# Patient Record
Sex: Female | Born: 2000 | Race: White | Hispanic: No | Marital: Single | Smoking: Never smoker
Health system: Southern US, Community
[De-identification: ages and names within clinical notes are randomized; demographics above are authoritative.]

## PROBLEM LIST (undated history)

## (undated) DIAGNOSIS — Z23 Encounter for immunization: Secondary | ICD-10-CM

## (undated) DIAGNOSIS — B001 Herpesviral vesicular dermatitis: Secondary | ICD-10-CM

## (undated) HISTORY — DX: Encounter for immunization: Z23

## (undated) HISTORY — DX: Herpesviral vesicular dermatitis: B00.1

## (undated) HISTORY — PX: TONSILLECTOMY: SUR1361

## (undated) HISTORY — PX: WISDOM TOOTH EXTRACTION: SHX21

---
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
## 2020-05-21 ENCOUNTER — Other Ambulatory Visit: Payer: Self-pay

## 2020-05-21 MED ORDER — ACYCLOVIR 400 MG PO TABS
400.0000 mg | ORAL_TABLET | Freq: Three times a day (TID) | ORAL | 0 refills | Status: DC
Start: 2020-05-21 — End: 2020-07-01

## 2020-07-01 ENCOUNTER — Other Ambulatory Visit: Payer: Self-pay | Admitting: Certified Nurse Midwife

## 2020-07-15 ENCOUNTER — Other Ambulatory Visit: Payer: Self-pay | Admitting: Certified Nurse Midwife

## 2021-01-20 ENCOUNTER — Other Ambulatory Visit: Payer: Self-pay | Admitting: Certified Nurse Midwife

## 2021-03-03 ENCOUNTER — Encounter: Payer: BC Managed Care – PPO | Admitting: Certified Nurse Midwife

## 2021-03-31 ENCOUNTER — Other Ambulatory Visit: Payer: Self-pay

## 2021-03-31 MED ORDER — MEDROXYPROGESTERONE ACETATE 150 MG/ML IM SUSP: 150.0000 mg | INTRAMUSCULAR | 0 refills | Status: DC

## 2021-04-20 ENCOUNTER — Telehealth (INDEPENDENT_AMBULATORY_CARE_PROVIDER_SITE_OTHER): Payer: BC Managed Care – PPO | Admitting: Certified Nurse Midwife

## 2021-04-20 ENCOUNTER — Encounter: Payer: Self-pay | Admitting: Certified Nurse Midwife

## 2021-04-20 DIAGNOSIS — Z3042 Encounter for surveillance of injectable contraceptive: Secondary | ICD-10-CM | POA: Diagnosis not present

## 2021-04-20 DIAGNOSIS — Z3009 Encounter for other general counseling and advice on contraception: Secondary | ICD-10-CM

## 2021-04-20 NOTE — Patient Instructions (Signed)
Contraceptive Injection A contraceptive injection is a shot that prevents pregnancy. It is also called a birth control shot. The shot contains the hormone progestin, which prevents pregnancy by: Stopping the ovaries from releasing eggs. Thickening cervical mucus to prevent sperm from entering the cervix. Thinning the lining of the uterus to prevent a fertilized egg from attaching to the uterus. Contraceptive injections are given under the skin (subcutaneous) or into a muscle (intramuscular). For these shots to work, you must get one of them every 3 months (12-13 weeks) from a health care provider. Tell a health care provider about: Any allergies you have. All medicines you are taking, including vitamins, herbs, eye drops, creams, and over-the-counter medicines. Any blood disorders you have. Any medical conditions you have. Whether you are pregnant or may be pregnant. What are the risks? Generally, this is a safe procedure. However, problems may occur, including: Mood changes or depression. Loss of bone density (osteoporosis) after long-term use. Blood clots. This is rare. Higher risk of an egg being fertilized outside your uterus (ectopic pregnancy).This is rare. What happens before the procedure? Your health care provider may do a routine physical exam. You may have a test to make sure you are not pregnant. What happens during the procedure?  The area where the shot will be given will be cleaned and sanitized with alcohol. A needle will be inserted into a muscle in your upper arm or buttock, or into the skin of your thigh or abdomen. The needle will be attached to a syringe with the medicine inside of it. The medicine will be pushed through the syringe and injected into your body. A small bandage (dressing) may be placed over the injection site. What can I expect after the procedure? After the procedure, it is common to have: Soreness around the injection site for a couple of  days. Irregular menstrual bleeding. Weight gain. Breast tenderness. Headaches. Discomfort in your abdomen. Ask your health care provider whether you need to use an added method of birth control (backup contraception), such as a condom, sponge, or spermicide. If the first shot is given 1-7 days after the start of your last menstrual period, you will not need backup contraception. If the first shot is given at any other time during your menstrual cycle, you should avoid having sex. If you do have sex, you will need to use backup contraception for 7 days after you receive the shot. Follow these instructions at home: General instructions Take over-the-counter and prescription medicines only as told by your health care provider. Do not rub or massage the injection site. Track your menstrual periods so you will know if they become irregular. Always use a condom to protect against sexually transmitted infections (STIs). Make sure you schedule an appointment in time for your next shot and mark it on your calendar. You must get an injection every 3 months (12-13 weeks) to prevent pregnancy. Lifestyle Do not use any products that contain nicotine or tobacco. These products include cigarettes, chewing tobacco, and vaping devices, such as e-cigarettes. If you need help quitting, ask your health care provider. Eat foods that are high in calcium and vitamin D, such as milk, cheese, and salmon. Doing this may help with any loss in bone density caused by the contraceptive injection. Ask your health care provider for dietary recommendations. Contact a health care provider if you: Have nausea or vomiting. Have abnormal vaginal discharge or bleeding. Miss a menstrual period or think you might be pregnant. Experience mood changes   or depression. Feel dizzy or light-headed. Have leg pain. Get help right away if you: Have chest pain or cough up blood. Have shortness of breath. Have a severe headache that does  not go away. Have numbness in any part of your body. Have slurred speech or vision problems. Have vaginal bleeding that is abnormally heavy or does not stop, or you have severe pain in your abdomen. Have depression that does not get better with treatment. If you ever feel like you may hurt yourself or others, or have thoughts about taking your own life, get help right away. Go to your nearest emergency department or: Call your local emergency services (911 in the [place] Call a suicide crisis helpline, such as the National Suicide Prevention Lifeline at 1-800-273-8255. This is open 24 hours a day in the [place] Text the Crisis Text Line at 741741 (in the [place] Summary A contraceptive injection is a shot that prevents pregnancy. It is also called the birth control shot. The shot is given under the skin (subcutaneous) or into a muscle (intramuscular). After this procedure, it is common to have soreness around the injection site for a couple of days. To prevent pregnancy, the shot must be given by a health care provider every 3 months (12-13 weeks). After you have the shot, ask your health care provider whether you need to use an added method of birth control (backup contraception), such as a condom, sponge, or spermicide. This information is not intended to replace advice given to you by your health care provider. Make sure you discuss any questions you have with your health care provider. Document Revised: 12/24/2019 Document Reviewed: 12/24/2019 Elsevier Patient Education  2022 Elsevier Inc.  

## 2021-04-20 NOTE — Progress Notes (Signed)
Virtual Visit via Video Note  I connected with Lori Holmes on 04/20/21 at 11:30 AM EDT by a video enabled telemedicine application and verified that I am speaking with the correct person using two identifiers.  Location: Patient: at school  Provider: at office    I discussed the limitations of evaluation and management by telemedicine and the availability of in person appointments. The patient expressed understanding and agreed to proceed.  History of Present Illness: PT needs refill on her depo provera injections.    Observations/Objective: She denies any contraindications to use. She gives the medication to herself. She was taught by her mother who is a paramedic. She has some spotting be other denies negative side effect.    Assessment and Plan: Orders placed for refill   Follow Up Instructions: As needed.    I discussed the assessment and treatment plan with the patient. The patient was provided an opportunity to ask questions and all were answered. The patient agreed with the plan and demonstrated an understanding of the instructions.   The patient was advised to call back or seek an in-person evaluation if the symptoms worsen or if the condition fails to improve as anticipated.  I provided 5 minutes of non-face-to-face time during this encounter.   Doreene Burke, CNM

## 2021-05-12 DIAGNOSIS — Z111 Encounter for screening for respiratory tuberculosis: Secondary | ICD-10-CM | POA: Diagnosis not present

## 2021-05-12 DIAGNOSIS — Z0184 Encounter for antibody response examination: Secondary | ICD-10-CM | POA: Diagnosis not present

## 2021-05-12 DIAGNOSIS — Z23 Encounter for immunization: Secondary | ICD-10-CM | POA: Diagnosis not present

## 2021-05-12 DIAGNOSIS — Z0000 Encounter for general adult medical examination without abnormal findings: Secondary | ICD-10-CM | POA: Diagnosis not present

## 2021-08-31 ENCOUNTER — Encounter: Payer: Self-pay | Admitting: Certified Nurse Midwife

## 2021-09-01 ENCOUNTER — Other Ambulatory Visit: Payer: Self-pay

## 2021-09-01 MED ORDER — VALACYCLOVIR HCL 500 MG PO TABS: 500.0000 mg | ORAL_TABLET | Freq: Two times a day (BID) | ORAL | 6 refills | Status: DC

## 2021-09-01 NOTE — Telephone Encounter (Signed)
Alternative Rx requested and approved by provider and sent to pt pharm. On file.  ?

## 2021-09-29 DIAGNOSIS — Z0184 Encounter for antibody response examination: Secondary | ICD-10-CM | POA: Diagnosis not present

## 2021-10-25 ENCOUNTER — Other Ambulatory Visit: Payer: Self-pay | Admitting: Certified Nurse Midwife

## 2021-12-08 ENCOUNTER — Other Ambulatory Visit: Payer: Self-pay | Admitting: Obstetrics

## 2022-01-11 ENCOUNTER — Encounter: Payer: Self-pay | Admitting: Certified Nurse Midwife

## 2022-01-16 ENCOUNTER — Encounter: Payer: Self-pay | Admitting: Certified Nurse Midwife

## 2022-03-10 DIAGNOSIS — Z23 Encounter for immunization: Secondary | ICD-10-CM | POA: Diagnosis not present

## 2022-03-18 DIAGNOSIS — Z23 Encounter for immunization: Secondary | ICD-10-CM | POA: Diagnosis not present

## 2022-10-21 ENCOUNTER — Other Ambulatory Visit: Payer: Self-pay

## 2022-10-25 ENCOUNTER — Other Ambulatory Visit: Payer: Self-pay | Admitting: Certified Nurse Midwife

## 2022-10-26 ENCOUNTER — Other Ambulatory Visit: Payer: Self-pay | Admitting: Certified Nurse Midwife

## 2022-10-26 MED ORDER — MEDROXYPROGESTERONE ACETATE 150 MG/ML IM SUSP: 150.0000 mg | INTRAMUSCULAR | 0 refills | Status: DC

## 2022-12-12 NOTE — Progress Notes (Unsigned)
   PCP:  Patient, No Pcp Per   No chief complaint on file.    HPI:      Lori Holmes is a 22 y.o. G0P0000 whose LMP was No LMP recorded. Patient has had an injection., presents today for her annual examination.  Her menses are {norm/abn:715}, lasting {number: 22536} days.  Dysmenorrhea {dysmen:716}. She {does:18564} have intermenstrual bleeding.  Sex activity: {sex active: 315163}.  Last Pap: {NWGN:562130865}  Results were: {norm/abn:16707::"no abnormalities"} /neg HPV DNA *** Hx of STDs: {STD hx:14358}  Last mammogram: {date:304500300}  Results were: {norm/abn:13465} There is no FH of breast cancer. There is no FH of ovarian cancer. The patient {does:18564} do self-breast exams.  Tobacco use: {tob:20664} Alcohol use: {Alcohol:11675} No drug use.  Exercise: {exercise:31265}  She {does:18564} get adequate calcium and Vitamin D in her diet.  Patient Active Problem List   Diagnosis Date Noted   Anxiety 11/28/2019   History of OCD (obsessive compulsive disorder) 11/28/2019    Past Surgical History:  Procedure Laterality Date   TONSILLECTOMY     WISDOM TOOTH EXTRACTION      Family History  Problem Relation Age of Onset   Cancer Maternal Grandmother    Diabetes Maternal Grandfather    Breast cancer Paternal Grandmother     Social History   Socioeconomic History   Marital status: Single    Spouse name: Not on file   Number of children: Not on file   Years of education: Not on file   Highest education level: Not on file  Occupational History   Not on file  Tobacco Use   Smoking status: Never   Smokeless tobacco: Never  Vaping Use   Vaping Use: Never used  Substance and Sexual Activity   Alcohol use: Never   Drug use: Never   Sexual activity: Yes    Birth control/protection: Injection  Other Topics Concern   Not on file  Social History Narrative   Not on file   Social Determinants of Health   Financial Resource Strain: Not on file  Food  Insecurity: Not on file  Transportation Needs: Not on file  Physical Activity: Not on file  Stress: Not on file  Social Connections: Not on file  Intimate Partner Violence: Not on file     Current Outpatient Medications:    medroxyPROGESTERone (DEPO-PROVERA) 150 MG/ML injection, SMARTSIG:1 Vial(s) IM Every 3 Months, Disp: , Rfl:    medroxyPROGESTERone (DEPO-PROVERA) 150 MG/ML injection, Inject 1 mL (150 mg total) into the muscle every 3 (three) months. OK to get injection this week and every 11 weeks if desires, Disp: 1 mL, Rfl: 0   medroxyPROGESTERone Acetate 150 MG/ML SUSY, INJECT 1 ML INTO THE MUSCLE EVERY 3 (THREE) MONTHS. OK TO GET INJECTION THIS WEEK AND EVERY 11 WEEKS IF DESIRES, Disp: 150 mL, Rfl: 3   valACYclovir (VALTREX) 500 MG tablet, TAKE 1 TABLET BY MOUTH TWICE A DAY, Disp: 180 tablet, Rfl: 1     ROS:  Review of Systems BREAST: No symptoms   Objective: There were no vitals taken for this visit.   OBGyn Exam  Results: No results found for this or any previous visit (from the past 24 hour(s)).  Assessment/Plan: No diagnosis found.  No orders of the defined types were placed in this encounter.            GYN counsel {counseling: 16159}     F/U  No follow-ups on file.  Bernetha Anschutz B. Aubriegh Minch, PA-C 12/12/2022 10:03 AM

## 2022-12-13 ENCOUNTER — Encounter: Payer: Self-pay | Admitting: Obstetrics and Gynecology

## 2022-12-13 ENCOUNTER — Ambulatory Visit (INDEPENDENT_AMBULATORY_CARE_PROVIDER_SITE_OTHER): Payer: BC Managed Care – PPO | Admitting: Obstetrics and Gynecology

## 2022-12-13 ENCOUNTER — Other Ambulatory Visit (HOSPITAL_COMMUNITY)
Admission: RE | Admit: 2022-12-13 | Discharge: 2022-12-13 | Disposition: A | Discharge status: Home / Self Care | Payer: BC Managed Care – PPO | Source: Ambulatory Visit | Attending: Obstetrics and Gynecology | Admitting: Obstetrics and Gynecology

## 2022-12-13 VITALS — BP 98/64 | Ht 65.0 in | Wt 147.0 lb

## 2022-12-13 DIAGNOSIS — Z113 Encounter for screening for infections with a predominantly sexual mode of transmission: Secondary | ICD-10-CM | POA: Diagnosis not present

## 2022-12-13 DIAGNOSIS — Z01419 Encounter for gynecological examination (general) (routine) without abnormal findings: Secondary | ICD-10-CM

## 2022-12-13 DIAGNOSIS — Z124 Encounter for screening for malignant neoplasm of cervix: Secondary | ICD-10-CM | POA: Diagnosis not present

## 2022-12-13 DIAGNOSIS — Z3042 Encounter for surveillance of injectable contraceptive: Secondary | ICD-10-CM

## 2022-12-13 MED ORDER — MEDROXYPROGESTERONE ACETATE 150 MG/ML IM SUSY
PREFILLED_SYRINGE | INTRAMUSCULAR | 3 refills | Status: DC
Start: 2022-12-13 — End: 2024-02-01

## 2022-12-15 LAB — CYTOLOGY - PAP
Chlamydia: NEGATIVE
Comment: NEGATIVE
Comment: NORMAL
Diagnosis: NEGATIVE
Neisseria Gonorrhea: NEGATIVE

## 2023-03-29 DIAGNOSIS — Z23 Encounter for immunization: Secondary | ICD-10-CM | POA: Diagnosis not present

## 2024-01-28 ENCOUNTER — Other Ambulatory Visit: Payer: Self-pay | Admitting: Obstetrics and Gynecology

## 2024-01-28 DIAGNOSIS — Z3042 Encounter for surveillance of injectable contraceptive: Secondary | ICD-10-CM

## 2024-02-01 ENCOUNTER — Other Ambulatory Visit: Payer: Self-pay

## 2024-02-01 DIAGNOSIS — Z3042 Encounter for surveillance of injectable contraceptive: Secondary | ICD-10-CM

## 2024-02-01 MED ORDER — MEDROXYPROGESTERONE ACETATE 150 MG/ML IM SUSY
PREFILLED_SYRINGE | INTRAMUSCULAR | 0 refills | Status: DC
Start: 2024-02-01 — End: 2024-03-06

## 2024-03-05 NOTE — Progress Notes (Unsigned)
 PCP:  Patient, No Pcp Per   No chief complaint on file.    HPI:      Lori Holmes is a 23 y.o. G0P0000 whose LMP was No LMP recorded. Patient has had an injection., presents today for her annual examination.  Her menses are absent due to depo. Has monthly BTB/spotting for 1-3 days, mild dysmen.   Sex activity: single partner, contraception - Depo-Provera  injections. No pain/bleeding.  Last Pap: 12/13/22  Results were NILM  There is a FH of breast cancer in her PGM, genetic testing not indicated. There is no FH of ovarian cancer. The patient does do self-breast exams.  Tobacco use: The patient denies current or previous tobacco use. Alcohol use: social drinker No drug use.  Exercise: moderately active  She does get adequate calcium but not Vitamin D in her diet. Hx of cold sores, takes vatrex prn. Doesn't need RF currently.   Gardasil completed  Patient Active Problem List   Diagnosis Date Noted   Anxiety 11/28/2019   History of OCD (obsessive compulsive disorder) 11/28/2019   Past Medical History:  Diagnosis Date   Cold sore    Vaccine for human papilloma virus (HPV) types 6, 11, 16, and 18 administered      Past Surgical History:  Procedure Laterality Date   TONSILLECTOMY     WISDOM TOOTH EXTRACTION      Family History  Problem Relation Age of Onset   Cancer - Other Maternal Aunt        Eye cancer   Uterine cancer Maternal Grandmother        62s   Melanoma Maternal Grandmother    Diabetes Maternal Grandfather    Breast cancer Paternal Grandmother        55ish    Social History   Socioeconomic History   Marital status: Single    Spouse name: Not on file   Number of children: Not on file   Years of education: Not on file   Highest education level: Not on file  Occupational History   Not on file  Tobacco Use   Smoking status: Never   Smokeless tobacco: Never  Vaping Use   Vaping status: Never Used  Substance and Sexual Activity    Alcohol use: Yes   Drug use: Never   Sexual activity: Yes    Birth control/protection: Injection, Condom  Other Topics Concern   Not on file  Social History Narrative   Not on file   Social Drivers of Health   Financial Resource Strain: Not on file  Food Insecurity: Not on file  Transportation Needs: Not on file  Physical Activity: Not on file  Stress: Not on file  Social Connections: Not on file  Intimate Partner Violence: Not on file     Current Outpatient Medications:    medroxyPROGESTERone  Acetate 150 MG/ML SUSY, INJECT 1 ML INTO THE MUSCLE EVERY 3 (THREE) MONTHS, Disp: 1 mL, Rfl: 0   valACYclovir  (VALTREX ) 500 MG tablet, TAKE 1 TABLET BY MOUTH TWICE A DAY, Disp: 180 tablet, Rfl: 1     ROS:  Review of Systems  Constitutional:  Negative for fatigue, fever and unexpected weight change.  Respiratory:  Negative for cough, shortness of breath and wheezing.   Cardiovascular:  Negative for chest pain, palpitations and leg swelling.  Gastrointestinal:  Negative for blood in stool, constipation, diarrhea, nausea and vomiting.  Endocrine: Negative for cold intolerance, heat intolerance and polyuria.  Genitourinary:  Negative for dyspareunia, dysuria,  flank pain, frequency, genital sores, hematuria, menstrual problem, pelvic pain, urgency, vaginal bleeding, vaginal discharge and vaginal pain.  Musculoskeletal:  Negative for back pain, joint swelling and myalgias.  Skin:  Negative for rash.  Neurological:  Negative for dizziness, syncope, light-headedness, numbness and headaches.  Hematological:  Negative for adenopathy.  Psychiatric/Behavioral:  Negative for agitation, confusion, sleep disturbance and suicidal ideas. The patient is not nervous/anxious.    BREAST: No symptoms   Objective: There were no vitals taken for this visit.   Physical Exam Constitutional:      Appearance: She is well-developed.  Genitourinary:     Vulva normal.     Right Labia: No rash,  tenderness or lesions.    Left Labia: No tenderness, lesions or rash.    No vaginal discharge, erythema or tenderness.      Right Adnexa: not tender and no mass present.    Left Adnexa: not tender and no mass present.    No cervical friability or polyp.     Uterus is not enlarged or tender.  Breasts:    Right: No mass, nipple discharge, skin change or tenderness.     Left: No mass, nipple discharge, skin change or tenderness.  Neck:     Thyroid: No thyromegaly.  Cardiovascular:     Rate and Rhythm: Normal rate and regular rhythm.     Heart sounds: Normal heart sounds. No murmur heard. Pulmonary:     Effort: Pulmonary effort is normal.     Breath sounds: Normal breath sounds.  Abdominal:     Palpations: Abdomen is soft.     Tenderness: There is no abdominal tenderness. There is no guarding or rebound.  Musculoskeletal:        General: Normal range of motion.     Cervical back: Normal range of motion.  Lymphadenopathy:     Cervical: No cervical adenopathy.  Neurological:     General: No focal deficit present.     Mental Status: She is alert and oriented to person, place, and time.     Cranial Nerves: No cranial nerve deficit.  Skin:    General: Skin is warm and dry.  Psychiatric:        Mood and Affect: Mood normal.        Behavior: Behavior normal.        Thought Content: Thought content normal.        Judgment: Judgment normal.  Vitals reviewed.     Assessment/Plan: Encounter for annual routine gynecological examination  Cervical cancer screening - Plan: Cytology - PAP  Screening for STD (sexually transmitted disease) - Plan: Cytology - PAP  Encounter for surveillance of injectable contraceptive - Plan: medroxyPROGESTERone  Acetate 150 MG/ML SUSY; Rx RF to pharm. Cont ca/add Vit D supp.   No orders of the defined types were placed in this encounter.            GYN counsel adequate intake of calcium and vitamin D, diet and exercise     F/U  No follow-ups on  file.  Lori Reta B. Mayzie Caughlin, PA-C 03/05/2024 4:41 PM

## 2024-03-06 ENCOUNTER — Other Ambulatory Visit (HOSPITAL_COMMUNITY)
Admission: RE | Admit: 2024-03-06 | Discharge: 2024-03-06 | Disposition: A | Discharge status: Home / Self Care | Source: Ambulatory Visit | Attending: Obstetrics and Gynecology | Admitting: Obstetrics and Gynecology

## 2024-03-06 ENCOUNTER — Encounter: Payer: Self-pay | Admitting: Obstetrics and Gynecology

## 2024-03-06 ENCOUNTER — Ambulatory Visit (INDEPENDENT_AMBULATORY_CARE_PROVIDER_SITE_OTHER): Payer: Self-pay | Admitting: Obstetrics and Gynecology

## 2024-03-06 VITALS — BP 121/84 | HR 97 | Ht 65.0 in | Wt 169.0 lb

## 2024-03-06 DIAGNOSIS — Z113 Encounter for screening for infections with a predominantly sexual mode of transmission: Secondary | ICD-10-CM | POA: Insufficient documentation

## 2024-03-06 DIAGNOSIS — Z3042 Encounter for surveillance of injectable contraceptive: Secondary | ICD-10-CM

## 2024-03-06 DIAGNOSIS — Z01419 Encounter for gynecological examination (general) (routine) without abnormal findings: Secondary | ICD-10-CM

## 2024-03-06 DIAGNOSIS — B001 Herpesviral vesicular dermatitis: Secondary | ICD-10-CM | POA: Diagnosis not present

## 2024-03-06 MED ORDER — VALACYCLOVIR HCL 1 G PO TABS
2000.0000 mg | ORAL_TABLET | Freq: Two times a day (BID) | ORAL | 1 refills | Status: AC
Start: 2024-03-06 — End: ?

## 2024-03-06 NOTE — Patient Instructions (Signed)
 I value your feedback and you entrusting [place] with your care. If you get a [place] patient survey, I would appreciate you taking the time to let us know about your experience today. Thank you! ? ? ?

## 2024-03-07 LAB — CERVICOVAGINAL ANCILLARY ONLY
Chlamydia: NEGATIVE
Comment: NEGATIVE
Comment: NORMAL
Neisseria Gonorrhea: NEGATIVE

## 2024-05-03 ENCOUNTER — Other Ambulatory Visit: Payer: Self-pay | Admitting: Obstetrics and Gynecology

## 2024-05-03 DIAGNOSIS — Z3042 Encounter for surveillance of injectable contraceptive: Secondary | ICD-10-CM
